# Patient Record
Sex: Female | Born: 2000
Health system: Southern US, Community
[De-identification: ages and names within clinical notes are randomized; demographics above are authoritative.]

## PROBLEM LIST (undated history)

## (undated) DIAGNOSIS — N946 Dysmenorrhea, unspecified: Secondary | ICD-10-CM

## (undated) HISTORY — DX: Dysmenorrhea, unspecified: N94.6

---
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
## 2018-06-07 ENCOUNTER — Ambulatory Visit (INDEPENDENT_AMBULATORY_CARE_PROVIDER_SITE_OTHER): Payer: 59 | Admitting: Primary Care

## 2018-06-07 ENCOUNTER — Encounter: Payer: Self-pay | Admitting: Primary Care

## 2018-06-07 ENCOUNTER — Telehealth: Payer: Self-pay | Admitting: Primary Care

## 2018-06-07 VITALS — BP 124/84 | HR 105 | Temp 98.1°F | Ht 63.75 in | Wt 142.0 lb

## 2018-06-07 DIAGNOSIS — Z3041 Encounter for surveillance of contraceptive pills: Secondary | ICD-10-CM

## 2018-06-07 DIAGNOSIS — R143 Flatulence: Secondary | ICD-10-CM | POA: Diagnosis not present

## 2018-06-07 NOTE — Assessment & Plan Note (Signed)
Chronic since childhood, manages with diet. Agree to allowing refrigerator in her dorm room so she can avoid cafeteria food. Will complete form.  Exam unremarkable today.

## 2018-06-07 NOTE — Telephone Encounter (Signed)
Please notify patient that I completed her forms for which are ready for pick up. Also I need the name of her prior PCP for records. Thanks!

## 2018-06-07 NOTE — Assessment & Plan Note (Signed)
Doing well on current OCP's for dysmenorrhea, continue same. Pap smear due at 19.

## 2018-06-07 NOTE — Patient Instructions (Addendum)
I will review and complete your forms this weekend, they will be ready for pick up Monday morning.  Make sure to avoid gas producing foods such as cabbage, broccoli, brussels sprouts, potatoes, fried/fatty food.  Simethicone can be taken occasionally as needed for severe gas pain.   Best of luck this semester!  It was a pleasure to meet you today! Please don't hesitate to call or message me with any questions. Welcome to [facility]!

## 2018-06-07 NOTE — Progress Notes (Signed)
Subjective:    Patient ID: Erika Stokes, female    DOB: 2001/06/27, 17 y.o.   MRN: [id number]  HPI  Erika Stokes is an 17 year old female who presents today to establish care and discuss the problems mentioned below. Will obtain old records.  1) Dysmenorrhea: Currently managed on Junel 1/20 mg-mcg for which she started in July 2018. Menarche at age 17. Her periods are monthly, lasting 6-7 days. Her menstrual cycles are not heavy and she has little menstrual cramping. Prior to OCP's she suffered from significant menstrual cramping.   2) Flatulence: History of since childhood and will treat by closely monitoring her dietary intake. She finds that monitoring her diet helps to prevent buildup of gas.   She is currently at [employer] and is living on campus. Given her excessive flatulence she has to avoid food in the cafeteria as it will aggravate symptoms. THis school year she plans on purchasing and fixing her own food in order to prevent symptoms so she will require a refrigerator to keep in her room. She needs permission in order to allow a refrigerator and has a form for us to complete today.   Bowel movements are regular, once daily. With dietary management of flatulence she doesn't have to take medications OTC for symptoms.    Review of Systems  Constitutional: Negative for fever.  Gastrointestinal: Negative for abdominal pain, constipation, diarrhea and nausea.       Flatulence   Genitourinary: Negative for menstrual problem.  Skin: Negative for color change.       Past Medical History:  Diagnosis Date  . Dysmenorrhea      Social History   Socioeconomic History  . Marital status: Single    Spouse name: Not on file  . Number of children: Not on file  . Years of education: Not on file  . Highest education level: Not on file  Occupational History  . Not on file  Social Needs  . Financial resource strain: Not on file  . Food insecurity:    Worry: Not on  file    Inability: Not on file  . Transportation needs:    Medical: Not on file    Non-medical: Not on file  Tobacco Use  . Smoking status: Never Smoker  . Smokeless tobacco: Never Used  Substance and Sexual Activity  . Alcohol use: No  . Drug use: Not on file  . Sexual activity: Not on file  Lifestyle  . Physical activity:    Days per week: Not on file    Minutes per session: Not on file  . Stress: Not on file  Relationships  . Social connections:    Talks on phone: Not on file    Gets together: Not on file    Attends religious service: Not on file    Active member of club or organization: Not on file    Attends meetings of clubs or organizations: Not on file    Relationship status: Not on file  . Intimate partner violence:    Fear of current or ex partner: Not on file    Emotionally abused: Not on file    Physically abused: Not on file    Forced sexual activity: Not on file  Other Topics Concern  . Not on file  Social History Narrative   Student.   Rising Sophomore.    Studying Women's Studies.   Enjoys dancing.     History reviewed. No pertinent surgical history.  Family History  Problem Relation Age of Onset  . Hypertension Mother   . Melanoma Father   . Hypertension Maternal Grandmother   . Hypertension Maternal Grandfather   . Hypertension Paternal Grandmother     No Known Allergies  Current Outpatient Medications on File Prior to Visit  Medication Sig Dispense Refill  . JUNEL FE 1/20 1-20 MG-MCG tablet Take 1 tablet by mouth daily.  1   No current facility-administered medications on file prior to visit.     BP 124/84   Pulse (!) 105   Temp 98.1 F (36.7 C) (Oral)   Ht 5' 3.75" (1.619 m)   Wt 142 lb (64.4 kg)   LMP 05/26/2018   SpO2 97%   BMI 24.57 kg/m    Objective:   Physical Exam  Constitutional: She appears well-nourished.  Neck: Neck supple.  Cardiovascular: Normal rate and regular rhythm.  Respiratory: Effort normal and breath  sounds normal.  GI: Soft. Bowel sounds are normal. There is no tenderness.  Skin: Skin is warm and dry.  Psychiatric: She has a normal mood and affect.           Assessment & Plan:

## 2018-06-10 NOTE — Telephone Encounter (Signed)
Notified patient's mother (on [place]) that paperwork are ready for pick up. Left in the front office.  Prior PCP [facility]  [facility] [phone] Fax [phone]

## 2018-06-10 NOTE — Telephone Encounter (Signed)
Noted  

## 2018-10-19 ENCOUNTER — Emergency Department (HOSPITAL_COMMUNITY)
Admission: EM | Admit: 2018-10-19 | Discharge: 2018-10-19 | Disposition: A | Discharge status: Home / Self Care | Payer: 59 | Attending: Emergency Medicine | Admitting: Emergency Medicine

## 2018-10-19 ENCOUNTER — Encounter (HOSPITAL_COMMUNITY): Payer: Self-pay | Admitting: Pharmacy Technician

## 2018-10-19 ENCOUNTER — Other Ambulatory Visit: Payer: Self-pay

## 2018-10-19 DIAGNOSIS — M549 Dorsalgia, unspecified: Secondary | ICD-10-CM | POA: Insufficient documentation

## 2018-10-19 DIAGNOSIS — R519 Headache, unspecified: Secondary | ICD-10-CM

## 2018-10-19 DIAGNOSIS — Y999 Unspecified external cause status: Secondary | ICD-10-CM | POA: Insufficient documentation

## 2018-10-19 DIAGNOSIS — M542 Cervicalgia: Secondary | ICD-10-CM | POA: Insufficient documentation

## 2018-10-19 DIAGNOSIS — M7918 Myalgia, other site: Secondary | ICD-10-CM

## 2018-10-19 DIAGNOSIS — R51 Headache: Secondary | ICD-10-CM | POA: Insufficient documentation

## 2018-10-19 DIAGNOSIS — Z79899 Other long term (current) drug therapy: Secondary | ICD-10-CM | POA: Insufficient documentation

## 2018-10-19 DIAGNOSIS — Y92410 Unspecified street and highway as the place of occurrence of the external cause: Secondary | ICD-10-CM | POA: Insufficient documentation

## 2018-10-19 DIAGNOSIS — Y9389 Activity, other specified: Secondary | ICD-10-CM | POA: Insufficient documentation

## 2018-10-19 MED ORDER — IBUPROFEN 200 MG PO TABS
600.0000 mg | ORAL_TABLET | Freq: Once | ORAL | Status: AC
  Administered 2018-10-19: 600 mg via ORAL
  Filled 2018-10-19: qty 1

## 2018-10-19 NOTE — Discharge Instructions (Signed)
The pain you are experiencing is likely due to muscle strain, you may take Ibuprofen 600 mg every 6 hours. Do not combine with any pain reliever other than tylenol.  You may also use ice and heat, and over-the-counter remedies such as Biofreeze gel or salon pas lidocaine patches. The muscle soreness should improve over the next week. Follow up with your family doctor in the next week for a recheck if you are still having symptoms. Return to ED if pain is worsening, you develop weakness or numbness of extremities, or new or concerning symptoms develop.  You were examined today for a head injury and possible concussion. Based on your exam head imaging was not deemed necessary today.  Sometimes serious problems can develop after a head injury. Please return to the emergency department if you experience any of the following symptoms: Repeated vomiting Headache that gets worse and does not go away Loss of consciousness or inability to stay awake at times when you normally would be able to Getting more confused, restless or agitated Convulsions or seizures Difficulty walking or feeling off balance Weakness or numbness Vision changes
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
## 2018-10-19 NOTE — ED Notes (Signed)
Pt stable, ambulatory, states understanding of discharge instructions 
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
## 2018-10-22 ENCOUNTER — Other Ambulatory Visit: Payer: Self-pay

## 2018-10-22 ENCOUNTER — Emergency Department (HOSPITAL_COMMUNITY)
Admission: EM | Admit: 2018-10-22 | Discharge: 2018-10-22 | Disposition: A | Discharge status: Home / Self Care | Payer: Self-pay | Attending: Emergency Medicine | Admitting: Emergency Medicine

## 2018-10-22 ENCOUNTER — Emergency Department (HOSPITAL_COMMUNITY): Payer: Self-pay

## 2018-10-22 ENCOUNTER — Ambulatory Visit: Payer: Self-pay | Admitting: Primary Care

## 2018-10-22 DIAGNOSIS — Y998 Other external cause status: Secondary | ICD-10-CM | POA: Insufficient documentation

## 2018-10-22 DIAGNOSIS — Y92410 Unspecified street and highway as the place of occurrence of the external cause: Secondary | ICD-10-CM | POA: Insufficient documentation

## 2018-10-22 DIAGNOSIS — Z79899 Other long term (current) drug therapy: Secondary | ICD-10-CM | POA: Insufficient documentation

## 2018-10-22 DIAGNOSIS — S161XXA Strain of muscle, fascia and tendon at neck level, initial encounter: Secondary | ICD-10-CM

## 2018-10-22 DIAGNOSIS — Y939 Activity, unspecified: Secondary | ICD-10-CM | POA: Insufficient documentation

## 2018-10-22 DIAGNOSIS — S39012A Strain of muscle, fascia and tendon of lower back, initial encounter: Secondary | ICD-10-CM

## 2018-10-22 MED ORDER — METHOCARBAMOL 750 MG PO TABS: 750.0000 mg | ORAL_TABLET | Freq: Three times a day (TID) | ORAL | 0 refills | Status: DC | PRN

## 2018-10-22 MED ORDER — METHOCARBAMOL 500 MG PO TABS: 500.0000 mg | ORAL_TABLET | Freq: Two times a day (BID) | ORAL | 0 refills | Status: DC

## 2018-10-22 MED ORDER — IBUPROFEN 400 MG PO TABS
400.0000 mg | ORAL_TABLET | Freq: Once | ORAL | Status: AC
  Administered 2018-10-22: 400 mg via ORAL
  Filled 2018-10-22: qty 1
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
## 2018-10-22 NOTE — ED Notes (Signed)
Pt stable and ambulatory for discharge, states understanding follow up.  

## 2019-01-06 ENCOUNTER — Ambulatory Visit (INDEPENDENT_AMBULATORY_CARE_PROVIDER_SITE_OTHER): Payer: PPO | Admitting: Primary Care

## 2019-01-06 ENCOUNTER — Encounter: Payer: Self-pay | Admitting: Primary Care

## 2019-01-06 VITALS — BP 122/82 | HR 88 | Temp 98.0°F | Ht 63.75 in | Wt 134.5 lb

## 2019-01-06 DIAGNOSIS — Z3041 Encounter for surveillance of contraceptive pills: Secondary | ICD-10-CM | POA: Diagnosis not present

## 2019-01-06 MED ORDER — JUNEL FE 1/20 1-20 MG-MCG PO TABS: 1.0000 | ORAL_TABLET | Freq: Every day | ORAL | 3 refills | Status: DC

## 2019-01-06 NOTE — Patient Instructions (Signed)
Continue your birth control pills daily.   I will see you in one year or sooner if needed. It was a pleasure to see you today!   Oral Contraception Information Oral contraceptive pills (OCPs) are medicines taken to prevent pregnancy. OCPs are taken by mouth, and they work by:  Preventing the ovaries from releasing eggs.  Thickening mucus in the lower part of the uterus (cervix), which prevents sperm from entering the uterus.  Thinning the lining of the uterus (endometrium), which prevents a fertilized egg from attaching to the endometrium. OCPs are highly effective when taken exactly as prescribed. However, OCPs do not prevent STIs (sexually transmitted infections). Safe sex practices, such as using condoms while on an OCP, can help prevent STIs. Before starting OCPs Before you start taking OCPs, you may have a physical exam, blood test, and Pap test. However, you are not required to have a pelvic exam in order to be prescribed OCPs. Your health care provider will make sure you are a good candidate for oral contraception. OCPs are not a good option for certain women, including women who smoke and are older than 35 years, and women with a medical history of high blood pressure, deep vein thrombosis, pulmonary embolism, stroke, cardiovascular disease, or peripheral vascular disease. Discuss with your health care provider the possible side effects of the OCP you may be prescribed. When you start an OCP, be aware that it can take 2-3 months for your body to adjust to changes in hormone levels. Follow instructions from your health care provider about how to start taking your first cycle of OCPs. Depending on when you start the pill, you may need to use a backup form of birth control, such as condoms, during the first week. Make sure you know what steps to take if you ever forget to take the pill. Types of oral contraception  The most common types of birth control pills contain the hormones estrogen  and progestin (synthetic progesterone) or progestin only. The combination pill This type of pill contains estrogen and progestin hormones. Combination pills often come in packs of 21, 28, or 91 pills. For each pack, the last 7 pills may not contain hormones, which means you may stop taking the pills for 7 days. Menstrual bleeding occurs during the week that you do not take the pills or that you take the pills with no hormones in them. The minipill This type of pill contains the progestin hormone only. It comes in packs of 28 pills. All 28 pills contain the hormone. You take the pill every day. It is very important to take the pill at the same time each day. Advantages of oral contraceptive pills  Provides reliable and continuous contraception if taken as instructed.  May treat or decrease symptoms of: ? Menstrual period cramps. ? Irregular menstrual cycle or bleeding. ? Heavy menstrual flow. ? Abnormal uterine bleeding. ? Acne, depending on the type of pill. ? Polycystic ovarian syndrome. ? Endometriosis. ? Iron deficiency anemia. ? Premenstrual symptoms, including premenstrual dysphoric disorder.  May reduce the risk of endometrial and ovarian cancer.  Can be used as emergency contraception.  Prevents mislocated (ectopic) pregnancies and infections of the fallopian tubes. Things that can make oral contraceptive pills less effective OCPs can be less effective if:  You forget to take the pill at the same time every day. This is especially important when taking the minipill.  You have a stomach or intestinal disease that reduces your body's ability to absorb the  pill.  You take OCPs with other medicines that make OCPs less effective, such as antibiotics, certain HIV medicines, and some seizure medicines.  You take expired OCPs.  You forget to restart the pill on day 7, if using the packs of 21 pills. Risks associated with oral contraceptive pills Oral contraceptive pills can  sometimes cause side effects, such as:  Headache.  Depression.  Trouble sleeping.  Nausea and vomiting.  Breast tenderness.  Irregular bleeding or spotting during the first several months.  Bloating or fluid retention.  Increase in blood pressure. Combination pills are also associated with a small increase in the risk of:  Blood clots.  Heart attack.  Stroke. Summary  Oral contraceptive pills are medicines taken by mouth to prevent pregnancy. They are highly effective when taken exactly as prescribed.  The most common types of birth control pills contain the hormones estrogen and progestin (synthetic progesterone) or progestin only.  Before you start taking the pill, you may have a physical exam, blood test, and Pap test. Your health care provider will make sure you are a good candidate for oral contraception.  The combination pill may come in a 21-day pack, a 28-day pack, or a 91-day pack. The minipill contains the progesterone hormone only and comes in packs of 28 pills.  Oral contraceptive pills can sometimes cause side effects, such as headache, nausea, breast tenderness, or irregular bleeding. This information is not intended to replace advice given to you by your health care provider. Make sure you discuss any questions you have with your health care provider. Document Released: 01/06/2003 Document Revised: 01/09/2017 Document Reviewed: 01/09/2017 Elsevier Interactive Patient Education  2019 [employer].

## 2019-01-06 NOTE — Progress Notes (Signed)
Subjective:    Patient ID: Erika Stokes, female    DOB: 2001/02/21, 18 y.o.   MRN: [id number]  HPI  Erika Stokes is a 18 year old female who presents today for medication refill.   Erika Stokes is currently managed on Junel FE 1/20. Menstrual cycles are once monthly lasting six days total. Her menses is light without much cramping. Erika Stokes doesn't have to take anything regularly for cramping. Erika Stokes has a missed a few pills over the last year mostly when traveling.   Erika Stokes denies vaginal itching/discharge, abnormal uterine bleeding. Erika Stokes does not smoke.  Review of Systems  Respiratory: Negative for shortness of breath.   Cardiovascular: Negative for chest pain.  Genitourinary: Negative for dysuria, menstrual problem and vaginal discharge.       Past Medical History:  Diagnosis Date  . Dysmenorrhea      Social History   Socioeconomic History  . Marital status: Single    Spouse name: Not on file  . Number of children: Not on file  . Years of education: Not on file  . Highest education level: Not on file  Occupational History  . Not on file  Social Needs  . Financial resource strain: Not on file  . Food insecurity:    Worry: Not on file    Inability: Not on file  . Transportation needs:    Medical: Not on file    Non-medical: Not on file  Tobacco Use  . Smoking status: Never Smoker  . Smokeless tobacco: Never Used  Substance and Sexual Activity  . Alcohol use: No  . Drug use: Not on file  . Sexual activity: Not on file  Lifestyle  . Physical activity:    Days per week: Not on file    Minutes per session: Not on file  . Stress: Not on file  Relationships  . Social connections:    Talks on phone: Not on file    Gets together: Not on file    Attends religious service: Not on file    Active member of club or organization: Not on file    Attends meetings of clubs or organizations: Not on file    Relationship status: Not on file  . Intimate partner violence:    Fear  of current or ex partner: Not on file    Emotionally abused: Not on file    Physically abused: Not on file    Forced sexual activity: Not on file  Other Topics Concern  . Not on file  Social History Narrative   Student.   Rising Sophomore.    Studying Women's Studies.   Enjoys dancing.     No past surgical history on file.  Family History  Problem Relation Age of Onset  . Hypertension Mother   . Melanoma Father   . Hypertension Maternal Grandmother   . Hypertension Maternal Grandfather   . Hypertension Paternal Grandmother     No Known Allergies  No current outpatient medications on file prior to visit.   No current facility-administered medications on file prior to visit.     BP 122/82   Pulse 88   Temp 98 F (36.7 C) (Oral)   Ht 5' 3.75" (1.619 m)   Wt 134 lb 8 oz (61 kg)   LMP 12/14/2018   SpO2 97%   BMI 23.27 kg/m    Objective:   Physical Exam  Constitutional: Erika Stokes appears well-nourished.  Neck: Neck supple.  Cardiovascular: Normal rate and regular rhythm.  Respiratory:  Effort normal and breath sounds normal.  Skin: Skin is warm and dry.  Psychiatric: Erika Stokes has a normal mood and affect.           Assessment & Plan:

## 2019-01-06 NOTE — Assessment & Plan Note (Signed)
Due for medication refills. Menstrual cycles routine and without abnormal symptoms.  Pap smear due at age 18. Refills sent to pharmacy.
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
## 2019-12-22 ENCOUNTER — Other Ambulatory Visit: Payer: Self-pay | Admitting: Primary Care

## 2019-12-22 DIAGNOSIS — Z3041 Encounter for surveillance of contraceptive pills: Secondary | ICD-10-CM

## 2020-02-12 ENCOUNTER — Ambulatory Visit: Payer: Medicaid Other | Attending: Internal Medicine

## 2020-02-12 DIAGNOSIS — Z23 Encounter for immunization: Secondary | ICD-10-CM

## 2020-02-12 NOTE — Progress Notes (Signed)
   Covid-19 Vaccination Clinic  Name:  Erika Stokes    MRN: [id number] DOB: 11/04/2000  02/12/2020  Erika Stokes was observed post Covid-19 immunization for 15 minutes without incident. She was provided with Vaccine Information Sheet and instruction to access the V-Safe system.   Erika Stokes was instructed to call 911 with any severe reactions post vaccine: Marland Kitchen Difficulty breathing  . Swelling of face and throat  . A fast heartbeat  . A bad rash all over body  . Dizziness and weakness   Immunizations Administered    Name Date Dose VIS Date Route   Pfizer COVID-19 Vaccine 02/12/2020 11:18 AM 0.3 mL 10/10/2019 Intramuscular   Manufacturer: [employer], [employer]   Lot: [id number]   NDC: [address]-1

## 2020-02-23 ENCOUNTER — Other Ambulatory Visit: Payer: Self-pay | Admitting: Primary Care

## 2020-02-23 DIAGNOSIS — Z3041 Encounter for surveillance of contraceptive pills: Secondary | ICD-10-CM

## 2020-03-08 ENCOUNTER — Ambulatory Visit: Payer: Medicaid Other | Attending: Internal Medicine

## 2020-03-08 DIAGNOSIS — Z23 Encounter for immunization: Secondary | ICD-10-CM

## 2020-03-08 NOTE — Progress Notes (Signed)
   Covid-19 Vaccination Clinic  Name:  Erika Stokes    MRN: [id number] DOB: 2001-08-13  03/08/2020  [name] was observed post Covid-19 immunization for 15 minutes without incident. She was provided with Vaccine Information Sheet and instruction to access the V-Safe system.   [name] was instructed to call 911 with any severe reactions post vaccine: Marland Kitchen Difficulty breathing  . Swelling of face and throat  . A fast heartbeat  . A bad rash all over body  . Dizziness and weakness   Immunizations Administered    Name Date Dose VIS Date Route   Pfizer COVID-19 Vaccine 03/08/2020 11:12 AM 0.3 mL 12/24/2018 Intramuscular   Manufacturer: [employer], [employer]   Lot: [id number]   NDC: [address]-3

## 2020-03-13 ENCOUNTER — Other Ambulatory Visit: Payer: Self-pay | Admitting: Primary Care

## 2020-03-13 DIAGNOSIS — Z3041 Encounter for surveillance of contraceptive pills: Secondary | ICD-10-CM

## 2020-05-05 ENCOUNTER — Encounter: Payer: Medicaid Other | Admitting: Primary Care

## 2020-05-06 ENCOUNTER — Telehealth: Payer: Self-pay | Admitting: Primary Care

## 2020-05-06 NOTE — Telephone Encounter (Signed)
Patient and her insurance [employer] called and said they can't add Erika Stokes as her primary care provider on her insurance card due to the plan being closed. They said we need someone to email [email or web address] to get the plan opened for patient to be covered. I am not sure who handles this? I let them know that this would be more with billing but the [employer] rep said someone in our office would be able to handle this request. Pt has an appointment tomorrow 7/9.

## 2020-05-07 ENCOUNTER — Ambulatory Visit (INDEPENDENT_AMBULATORY_CARE_PROVIDER_SITE_OTHER): Payer: Medicaid Other | Admitting: Primary Care

## 2020-05-07 ENCOUNTER — Other Ambulatory Visit: Payer: Self-pay

## 2020-05-07 VITALS — BP 120/72 | HR 76 | Temp 95.7°F | Ht 63.75 in | Wt 149.8 lb

## 2020-05-07 DIAGNOSIS — Z0000 Encounter for general adult medical examination without abnormal findings: Secondary | ICD-10-CM | POA: Diagnosis not present

## 2020-05-07 DIAGNOSIS — Z3041 Encounter for surveillance of contraceptive pills: Secondary | ICD-10-CM | POA: Diagnosis not present

## 2020-05-07 MED ORDER — NORETHIN ACE-ETH ESTRAD-FE 1-20 MG-MCG PO TABS: 1.0000 | ORAL_TABLET | Freq: Every day | ORAL | 3 refills | Status: DC

## 2020-05-07 NOTE — Patient Instructions (Addendum)
Continue exercising. You should be getting 150 minutes of moderate intensity exercise weekly.  It's important to improve your diet by reducing consumption of fast food, fried food, processed snack foods, sugary drinks. Increase consumption of fresh vegetables and fruits, whole grains, water.  Ensure you are drinking 64 ounces of water daily.  Please inquire about the HPV vaccine series at the student health center.  It was a pleasure to see you today!   Preventive Care 45-24 Years Old, Female Preventive care refers to lifestyle choices and visits with your health care provider that can promote health and wellness. At this stage in your life, you may start seeing a primary care physician instead of a pediatrician. Your health care is now your responsibility. Preventive care for young adults includes:  A yearly physical exam. This is also called an annual wellness visit.  Regular dental and eye exams.  Immunizations.  Screening for certain conditions.  Healthy lifestyle choices, such as diet and exercise. What can I expect for my preventive care visit? Physical exam Your health care provider may check:  Height and weight. These may be used to calculate body mass index (BMI), which is a measurement that tells if you are at a healthy weight.  Heart rate and blood pressure.  Body temperature. Counseling Your health care provider may ask you questions about:  Past medical problems and family medical history.  Alcohol, tobacco, and drug use.  Home and relationship well-being.  Access to firearms.  Emotional well-being.  Diet, exercise, and sleep habits.  Sexual activity and sexual health.  Method of birth control.  Menstrual cycle.  Pregnancy history. What immunizations do I need?  Influenza (flu) vaccine  This is recommended every year. Tetanus, diphtheria, and pertussis (Tdap) vaccine  You may need a Td booster every 10 years. Varicella (chickenpox)  vaccine  You may need this vaccine if you have not already been vaccinated. Human papillomavirus (HPV) vaccine  If recommended by your health care provider, you may need three doses over 6 months. Measles, mumps, and rubella (MMR) vaccine  You may need at least one dose of MMR. You may also need a second dose. Meningococcal conjugate (MenACWY) vaccine  One dose is recommended if you are 73-47 years old and a Market researcher living in a residence hall, or if you have one of several medical conditions. You may also need additional booster doses. Pneumococcal conjugate (PCV13) vaccine  You may need this if you have certain conditions and were not previously vaccinated. Pneumococcal polysaccharide (PPSV23) vaccine  You may need one or two doses if you smoke cigarettes or if you have certain conditions. Hepatitis A vaccine  You may need this if you have certain conditions or if you travel or work in places where you may be exposed to hepatitis A. Hepatitis B vaccine  You may need this if you have certain conditions or if you travel or work in places where you may be exposed to hepatitis B. Haemophilus influenzae type b (Hib) vaccine  You may need this if you have certain risk factors. You may receive vaccines as individual doses or as more than one vaccine together in one shot (combination vaccines). Talk with your health care provider about the risks and benefits of combination vaccines. What tests do I need? Blood tests  Lipid and cholesterol levels. These may be checked every 5 years starting at age 60.  Hepatitis C test.  Hepatitis B test. Screening  Pelvic exam and Pap test. This  may be done every 3 years starting at age 59.  Sexually transmitted disease (STD) testing, if you are at risk.  BRCA-related cancer screening. This may be done if you have a family history of breast, ovarian, tubal, or peritoneal cancers. Other tests  Tuberculosis skin test.  Vision  and hearing tests.  Skin exam.  Breast exam. Follow these instructions at home: Eating and drinking   Eat a diet that includes fresh fruits and vegetables, whole grains, lean protein, and low-fat dairy products.  Drink enough fluid to keep your urine pale yellow.  Do not drink alcohol if: ? Your health care provider tells you not to drink. ? You are pregnant, may be pregnant, or are planning to become pregnant. ? You are under the legal drinking age. In the [place] the legal drinking age is 102.  If you drink alcohol: ? Limit how much you have to 0-1 drink a day. ? Be aware of how much alcohol is in your drink. In the [place] one drink equals one 12 oz bottle of beer (355 mL), one 5 oz glass of wine (148 mL), or one 1 oz glass of hard liquor (44 mL). Lifestyle  Take daily care of your teeth and gums.  Stay active. Exercise at least 30 minutes 5 or more days of the week.  Do not use any products that contain nicotine or tobacco, such as cigarettes, e-cigarettes, and chewing tobacco. If you need help quitting, ask your health care provider.  Do not use drugs.  If you are sexually active, practice safe sex. Use a condom or other form of birth control (contraception) in order to prevent pregnancy and STIs (sexually transmitted infections). If you plan to become pregnant, see your health care provider for a pre-conception visit.  Find healthy ways to cope with stress, such as: ? Meditation, yoga, or listening to music. ? Journaling. ? Talking to a trusted person. ? Spending time with friends and family. Safety  Always wear your seat belt while driving or riding in a vehicle.  Do not drive if you have been drinking alcohol. Do not ride with someone who has been drinking.  Do not drive when you are tired or distracted. Do not text while driving.  Wear a helmet and other protective equipment during sports activities.  If you have firearms in your house, make sure you follow all  gun safety procedures.  Seek help if you have been bullied, physically abused, or sexually abused.  Use the Internet responsibly to avoid dangers such as online bullying and online sex predators. What's next?  Go to your health care provider once a year for a well check visit.  Ask your health care provider how often you should have your eyes and teeth checked.  Stay up to date on all vaccines. This information is not intended to replace advice given to you by your health care provider. Make sure you discuss any questions you have with your health care provider. Document Revised: 10/10/2018 Document Reviewed: 10/10/2018 Elsevier Patient Education  2020 [employer].

## 2020-05-07 NOTE — Assessment & Plan Note (Signed)
Tetanus and HPV series due, she kindly declines today. She will inquire about the HPV series at the student health center.  Pap smear due at age 19. Encouraged a healthy diet and regular exercise. Exam today benign.  Follow up in 1 year.

## 2020-05-07 NOTE — Progress Notes (Signed)
Subjective:    Patient ID: Erika Stokes, female    DOB: 2001/04/14, 19 y.o.   MRN: [id number]  HPI  This visit occurred during the SARS-CoV-2 public health emergency.  Safety protocols were in place, including screening questions prior to the visit, additional usage of staff PPE, and extensive cleaning of exam room while observing appropriate contact time as indicated for disinfecting solutions.   Ms. Erika Stokes is a 19 year old female who presents today for complete physical.  Immunizations: -Tetanus: Completed in 2011 -Influenza: Due this season  -Covid-19: Completed series -HPV: Never completed, declines   Diet: She endorses a healthy diet.  Exercise: She is running twice weekly.   Eye exam: No recent exam Dental exam: Completes annually   BP Readings from Last 3 Encounters:  05/07/20 120/72  01/06/19 122/82  10/22/18 131/87     Review of Systems  Constitutional: Negative for unexpected weight change.  HENT: Negative for rhinorrhea.   Respiratory: Negative for cough and shortness of breath.   Cardiovascular: Negative for chest pain.  Gastrointestinal: Negative for constipation and diarrhea.  Genitourinary: Negative for difficulty urinating and menstrual problem.  Musculoskeletal: Negative for arthralgias and myalgias.  Skin: Negative for rash.  Allergic/Immunologic: Negative for environmental allergies.  Neurological: Negative for dizziness, numbness and headaches.  Psychiatric/Behavioral: The patient is not nervous/anxious.        Past Medical History:  Diagnosis Date  . Dysmenorrhea      Social History   Socioeconomic History  . Marital status: Single    Spouse name: Not on file  . Number of children: Not on file  . Years of education: Not on file  . Highest education level: Not on file  Occupational History  . Not on file  Tobacco Use  . Smoking status: Never Smoker  . Smokeless tobacco: Never Used  Substance and Sexual Activity  .  Alcohol use: No  . Drug use: Not on file  . Sexual activity: Not on file  Other Topics Concern  . Not on file  Social History Narrative   Student.   Rising Sophomore.    Studying Women's Studies.   Enjoys dancing.    Social Determinants of Health   Financial Resource Strain:   . Difficulty of Paying Living Expenses:   Food Insecurity:   . Worried About Programme researcher, broadcasting/film/video in the Last Year:   . Barista in the Last Year:   Transportation Needs:   . Freight forwarder (Medical):   Marland Kitchen Lack of Transportation (Non-Medical):   Physical Activity:   . Days of Exercise per Week:   . Minutes of Exercise per Session:   Stress:   . Feeling of Stress :   Social Connections:   . Frequency of Communication with Friends and Family:   . Frequency of Social Gatherings with Friends and Family:   . Attends Religious Services:   . Active Member of Clubs or Organizations:   . Attends Banker Meetings:   Marland Kitchen Marital Status:   Intimate Partner Violence:   . Fear of Current or Ex-Partner:   . Emotionally Abused:   Marland Kitchen Physically Abused:   . Sexually Abused:     No past surgical history on file.  Family History  Problem Relation Age of Onset  . Hypertension Mother   . Melanoma Father   . Hypertension Maternal Grandmother   . Hypertension Maternal Grandfather   . Hypertension Paternal Grandmother     No  Known Allergies  Current Outpatient Medications on File Prior to Visit  Medication Sig Dispense Refill  . norethindrone-ethinyl estradiol (JUNEL FE 1/20) 1-20 MG-MCG tablet Take 1 tablet by mouth daily. NEED APPOINTMENT FOR ANY MORE REFILLS 28 tablet 0   No current facility-administered medications on file prior to visit.    BP 120/72   Pulse 76   Temp (!) 95.7 F (35.4 C) (Temporal)   Ht 5' 3.75" (1.619 m)   Wt 149 lb 12 oz (67.9 kg)   LMP 04/30/2020   SpO2 98%   BMI 25.91 kg/m    Objective:   Physical Exam HENT:     Right Ear: Tympanic membrane and  ear canal normal.     Left Ear: Tympanic membrane and ear canal normal.  Eyes:     Pupils: Pupils are equal, round, and reactive to light.  Cardiovascular:     Rate and Rhythm: Normal rate and regular rhythm.  Pulmonary:     Effort: Pulmonary effort is normal.     Breath sounds: Normal breath sounds.  Abdominal:     General: Bowel sounds are normal.     Palpations: Abdomen is soft.     Tenderness: There is no abdominal tenderness.  Musculoskeletal:        General: Normal range of motion.     Cervical back: Neck supple.  Skin:    General: Skin is warm and dry.  Neurological:     Mental Status: She is alert and oriented to person, place, and time.     Cranial Nerves: No cranial nerve deficit.     Deep Tendon Reflexes:     Reflex Scores:      Patellar reflexes are 2+ on the right side and 2+ on the left side.           Assessment & Plan:

## 2020-05-07 NOTE — Assessment & Plan Note (Signed)
Doing well on OCP's, regular menstrual cycles. Pap smear due at age 19, she will return next year as she is in school in [place], [place].   Refills provided, printed Rx per patient request.

## 2020-05-10 NOTE — Telephone Encounter (Signed)
I have emailed [employer] and am just waiting on a response

## 2021-05-01 ENCOUNTER — Other Ambulatory Visit: Payer: Self-pay | Admitting: Primary Care

## 2021-05-01 DIAGNOSIS — Z3041 Encounter for surveillance of contraceptive pills: Secondary | ICD-10-CM

## 2021-05-01 NOTE — Telephone Encounter (Signed)
Received refill request for birth control from pharmacy in [place], [place]. Make sure this is correct. She is also due for CPE, will need for further refills.

## 2021-05-04 NOTE — Telephone Encounter (Signed)
Called patient that is where she gets sent during school she would like sent to [facility] in [place]. Also patient has med f/u on 7/8 and cpe on 8/9 can we do both at 7/8 appointment. Just let me know so that I can c/a one and make note in the other.

## 2021-05-04 NOTE — Telephone Encounter (Signed)
Yes, okay to do CPE and follow up at same visit. Refill(s) sent to pharmacy.

## 2021-05-06 ENCOUNTER — Ambulatory Visit: Payer: Medicaid Other | Admitting: Primary Care

## 2021-05-06 ENCOUNTER — Other Ambulatory Visit (HOSPITAL_COMMUNITY)
Admission: RE | Admit: 2021-05-06 | Discharge: 2021-05-06 | Disposition: A | Discharge status: Home / Self Care | Payer: Medicaid Other | Source: Ambulatory Visit | Attending: Primary Care | Admitting: Primary Care

## 2021-05-06 ENCOUNTER — Encounter: Payer: Self-pay | Admitting: Primary Care

## 2021-05-06 ENCOUNTER — Other Ambulatory Visit: Payer: Self-pay

## 2021-05-06 VITALS — BP 126/82 | HR 62 | Temp 97.6°F | Ht 64.0 in | Wt 163.0 lb

## 2021-05-06 DIAGNOSIS — Z124 Encounter for screening for malignant neoplasm of cervix: Secondary | ICD-10-CM | POA: Diagnosis not present

## 2021-05-06 DIAGNOSIS — Z0000 Encounter for general adult medical examination without abnormal findings: Secondary | ICD-10-CM | POA: Diagnosis not present

## 2021-05-06 DIAGNOSIS — Z3041 Encounter for surveillance of contraceptive pills: Secondary | ICD-10-CM

## 2021-05-06 MED ORDER — NORETHIN ACE-ETH ESTRAD-FE 1-20 MG-MCG PO TABS
1.0000 | ORAL_TABLET | Freq: Every day | ORAL | 3 refills | Status: AC
Start: 2021-05-06 — End: ?

## 2021-05-06 NOTE — Assessment & Plan Note (Signed)
Doing well on OCP's, pap smear completed today. Refills provided.

## 2021-05-06 NOTE — Assessment & Plan Note (Signed)
Tetanus due, declines today. Also declines HPV vaccines, she will think about this. Pap smear due and completed today.  Commended her on regular exercise and a healthy diet.   Exam today unremarkable.

## 2021-05-06 NOTE — Progress Notes (Signed)
Subjective:    Patient ID: Erika Stokes, female    DOB: 02-25-2001, 20 y.o.   MRN: [id number]  HPI  Erika Stokes is a very pleasant 20 y.o. female who presents today for complete physical.  Immunizations: -Tetanus: 2011, declines  -Covid-19: 2 vaccines  -HPV: Never completed  Diet: Fair diet.  Exercise: She is exercising 3-4 days weekly   Eye exam: No recent visit  Dental exam: Completes semi-annually   Pap Smear: Due, never completed  BP Readings from Last 3 Encounters:  05/06/21 126/82  05/07/20 120/72  01/06/19 122/82         Review of Systems  Constitutional:  Negative for unexpected weight change.  HENT:  Negative for rhinorrhea.   Respiratory:  Negative for cough and shortness of breath.   Cardiovascular:  Negative for chest pain.  Gastrointestinal:  Negative for constipation and diarrhea.  Genitourinary:  Negative for difficulty urinating and menstrual problem.  Musculoskeletal:  Negative for arthralgias and myalgias.  Skin:  Negative for rash.  Allergic/Immunologic: Negative for environmental allergies.  Neurological:  Negative for dizziness and headaches.  Psychiatric/Behavioral:  The patient is not nervous/anxious.         Past Medical History:  Diagnosis Date   Dysmenorrhea     Social History   Socioeconomic History   Marital status: Single    Spouse name: Not on file   Number of children: Not on file   Years of education: Not on file   Highest education level: Not on file  Occupational History   Not on file  Tobacco Use   Smoking status: Never   Smokeless tobacco: Never  Substance and Sexual Activity   Alcohol use: No   Drug use: Not on file   Sexual activity: Not on file  Other Topics Concern   Not on file  Social History Narrative   Student.   Rising Sophomore.    Studying Women's Studies.   Enjoys dancing.    Social Determinants of Health   Financial Resource Strain: Not on file  Food Insecurity:  Not on file  Transportation Needs: Not on file  Physical Activity: Not on file  Stress: Not on file  Social Connections: Not on file  Intimate Partner Violence: Not on file    History reviewed. No pertinent surgical history.  Family History  Problem Relation Age of Onset   Hypertension Mother    Melanoma Father    Hypertension Maternal Grandmother    Hypertension Maternal Grandfather    Hypertension Paternal Grandmother     No Known Allergies  Current Outpatient Medications on File Prior to Visit  Medication Sig Dispense Refill   norethindrone-ethinyl estradiol (JUNEL FE 1/20) 1-20 MG-MCG tablet Take 1 tablet by mouth daily. 84 tablet 3   No current facility-administered medications on file prior to visit.    BP 126/82   Pulse 62   Temp 97.6 F (36.4 C) (Temporal)   Ht 5\' 4"  (1.626 m)   Wt 163 lb (73.9 kg)   LMP 04/25/2021   SpO2 97%   BMI 27.98 kg/m  Objective:   Physical Exam HENT:     Right Ear: Tympanic membrane and ear canal normal.     Left Ear: Tympanic membrane and ear canal normal.     Nose: Nose normal.  Eyes:     Conjunctiva/sclera: Conjunctivae normal.     Pupils: Pupils are equal, round, and reactive to light.  Neck:     Thyroid: No thyromegaly.  Cardiovascular:     Rate and Rhythm: Normal rate and regular rhythm.     Heart sounds: No murmur heard. Pulmonary:     Effort: Pulmonary effort is normal.     Breath sounds: Normal breath sounds. No rales.  Abdominal:     General: Bowel sounds are normal.     Palpations: Abdomen is soft.     Tenderness: There is no abdominal tenderness.  Genitourinary:    Labia:        Right: No tenderness or lesion.        Left: No tenderness or lesion.      Vagina: Vaginal discharge present.     Cervix: No erythema or cervical bleeding.     Adnexa: Right adnexa normal and left adnexa normal.     Comments: Scant amount of whitish vaginal discharge, no smell. Musculoskeletal:        General: Normal range of  motion.     Cervical back: Neck supple.  Lymphadenopathy:     Cervical: No cervical adenopathy.  Skin:    General: Skin is warm and dry.     Findings: No rash.  Neurological:     Mental Status: She is alert and oriented to person, place, and time.     Cranial Nerves: No cranial nerve deficit.     Deep Tendon Reflexes: Reflexes are normal and symmetric.  Psychiatric:        Mood and Affect: Mood normal.          Assessment & Plan:      This visit occurred during the SARS-CoV-2 public health emergency.  Safety protocols were in place, including screening questions prior to the visit, additional usage of staff PPE, and extensive cleaning of exam room while observing appropriate contact time as indicated for disinfecting solutions.

## 2021-05-06 NOTE — Patient Instructions (Addendum)
We will be in touch regarding your pap smear results.  It was a pleasure to see you today!  Preventive Care 56-22 Years Old, Female Preventive care refers to lifestyle choices and visits with your health care provider that can promote health and wellness. This includes: A yearly physical exam. This is also called an annual wellness visit. Regular dental and eye exams. Immunizations. Screening for certain conditions. Healthy lifestyle choices, such as: Eating a healthy diet. Getting regular exercise. Not using drugs or products that contain nicotine and tobacco. Limiting alcohol use. What can I expect for my preventive care visit? Physical exam Your health care provider may check your: Height and weight. These may be used to calculate your BMI (body mass index). BMI is a measurement that tells if you are at a healthy weight. Heart rate and blood pressure. Body temperature. Skin for abnormal spots. Counseling Your health care provider may ask you questions about your: Past medical problems. Family's medical history. Alcohol, tobacco, and drug use. Emotional well-being. Home life and relationship well-being. Sexual activity. Diet, exercise, and sleep habits. Work and work Statistician. Access to firearms. Method of birth control. Menstrual cycle. Pregnancy history. What immunizations do I need?  Vaccines are usually given at various ages, according to a schedule. Your health care provider will recommend vaccines for you based on your age, medicalhistory, and lifestyle or other factors, such as travel or where you work. What tests do I need?  Blood tests Lipid and cholesterol levels. These may be checked every 5 years starting at age 71. Hepatitis C test. Hepatitis B test. Screening Diabetes screening. This is done by checking your blood sugar (glucose) after you have not eaten for a while (fasting). STD (sexually transmitted disease) testing, if you are at  risk. BRCA-related cancer screening. This may be done if you have a family history of breast, ovarian, tubal, or peritoneal cancers. Pelvic exam and Pap test. This may be done every 3 years starting at age 32. Starting at age 56, this may be done every 5 years if you have a Pap test in combination with an HPV test. Talk with your health care provider about your test results, treatment options,and if necessary, the need for more tests. Follow these instructions at home: Eating and drinking  Eat a healthy diet that includes fresh fruits and vegetables, whole grains, lean protein, and low-fat dairy products. Take vitamin and mineral supplements as recommended by your health care provider. Do not drink alcohol if: Your health care provider tells you not to drink. You are pregnant, may be pregnant, or are planning to become pregnant. If you drink alcohol: Limit how much you have to 0-1 drink a day. Be aware of how much alcohol is in your drink. In the [place] one drink equals one 12 oz bottle of beer (355 mL), one 5 oz glass of wine (148 mL), or one 1 oz glass of hard liquor (44 mL).  Lifestyle Take daily care of your teeth and gums. Brush your teeth every morning and night with fluoride toothpaste. Floss one time each day. Stay active. Exercise for at least 30 minutes 5 or more days each week. Do not use any products that contain nicotine or tobacco, such as cigarettes, e-cigarettes, and chewing tobacco. If you need help quitting, ask your health care provider. Do not use drugs. If you are sexually active, practice safe sex. Use a condom or other form of protection to prevent STIs (sexually transmitted infections). If you do not  wish to become pregnant, use a form of birth control. If you plan to become pregnant, see your health care provider for a prepregnancy visit. Find healthy ways to cope with stress, such as: Meditation, yoga, or listening to music. Journaling. Talking to a trusted  person. Spending time with friends and family. Safety Always wear your seat belt while driving or riding in a vehicle. Do not drive: If you have been drinking alcohol. Do not ride with someone who has been drinking. When you are tired or distracted. While texting. Wear a helmet and other protective equipment during sports activities. If you have firearms in your house, make sure you follow all gun safety procedures. Seek help if you have been physically or sexually abused. What's next? Go to your health care provider once a year for an annual wellness visit. Ask your health care provider how often you should have your eyes and teeth checked. Stay up to date on all vaccines. This information is not intended to replace advice given to you by your health care provider. Make sure you discuss any questions you have with your healthcare provider. Document Revised: 06/13/2020 Document Reviewed: 06/27/2018 Elsevier Patient Education  2022 [employer].

## 2021-05-10 LAB — CYTOLOGY - PAP
Adequacy: ABSENT
Comment: NEGATIVE
Diagnosis: NEGATIVE
High risk HPV: NEGATIVE

## 2021-06-07 ENCOUNTER — Encounter: Payer: Medicaid Other | Admitting: Primary Care
